# Patient Record
Sex: Female | Born: 1985 | Race: White | Hispanic: No | Marital: Married | Smoking: Current every day smoker
Health system: Southern US, Community
[De-identification: ages and names within clinical notes are randomized; demographics above are authoritative.]

## PROBLEM LIST (undated history)

## (undated) DIAGNOSIS — N200 Calculus of kidney: Secondary | ICD-10-CM

---

## 2014-09-03 ENCOUNTER — Emergency Department (HOSPITAL_COMMUNITY)
Admission: EM | Admit: 2014-09-03 | Discharge: 2014-09-03 | Disposition: A | Discharge status: Home / Self Care | Payer: Medicaid Other | Attending: Emergency Medicine | Admitting: Emergency Medicine

## 2014-09-03 ENCOUNTER — Encounter (HOSPITAL_COMMUNITY): Payer: Self-pay | Admitting: Physical Medicine and Rehabilitation

## 2014-09-03 DIAGNOSIS — W208XXA Other cause of strike by thrown, projected or falling object, initial encounter: Secondary | ICD-10-CM | POA: Diagnosis not present

## 2014-09-03 DIAGNOSIS — N23 Unspecified renal colic: Secondary | ICD-10-CM | POA: Insufficient documentation

## 2014-09-03 DIAGNOSIS — Z720 Tobacco use: Secondary | ICD-10-CM | POA: Diagnosis not present

## 2014-09-03 DIAGNOSIS — R109 Unspecified abdominal pain: Secondary | ICD-10-CM | POA: Diagnosis present

## 2014-09-03 HISTORY — DX: Calculus of kidney: N20.0

## 2014-09-03 LAB — CBC WITH DIFFERENTIAL/PLATELET
BASOS PCT: 1 % (ref 0–1)
Basophils Absolute: 0.1 10*3/uL (ref 0.0–0.1)
EOS ABS: 0.4 10*3/uL (ref 0.0–0.7)
EOS PCT: 4 % (ref 0–5)
HCT: 42.8 % (ref 36.0–46.0)
HEMOGLOBIN: 14.1 g/dL (ref 12.0–15.0)
LYMPHS ABS: 2.4 10*3/uL (ref 0.7–4.0)
Lymphocytes Relative: 26 % (ref 12–46)
MCH: 28.1 pg (ref 26.0–34.0)
MCHC: 32.9 g/dL (ref 30.0–36.0)
MCV: 85.3 fL (ref 78.0–100.0)
Monocytes Absolute: 0.4 10*3/uL (ref 0.1–1.0)
Monocytes Relative: 5 % (ref 3–12)
NEUTROS PCT: 64 % (ref 43–77)
Neutro Abs: 5.8 10*3/uL (ref 1.7–7.7)
PLATELETS: 198 10*3/uL (ref 150–400)
RBC: 5.02 MIL/uL (ref 3.87–5.11)
RDW: 14.3 % (ref 11.5–15.5)
WBC: 9 10*3/uL (ref 4.0–10.5)

## 2014-09-03 LAB — COMPREHENSIVE METABOLIC PANEL
ALBUMIN: 4.3 g/dL (ref 3.5–5.0)
ALT: 52 U/L (ref 14–54)
ANION GAP: 10 (ref 5–15)
AST: 49 U/L — ABNORMAL HIGH (ref 15–41)
Alkaline Phosphatase: 53 U/L (ref 38–126)
BUN: 5 mg/dL — ABNORMAL LOW (ref 6–20)
CHLORIDE: 108 mmol/L (ref 101–111)
CO2: 24 mmol/L (ref 22–32)
CREATININE: 0.8 mg/dL (ref 0.44–1.00)
Calcium: 8.8 mg/dL — ABNORMAL LOW (ref 8.9–10.3)
GFR calc non Af Amer: >60 mL/min (ref >60)
GLUCOSE: 78 mg/dL (ref 65–99)
Potassium: 3.6 mmol/L (ref 3.5–5.1)
SODIUM: 142 mmol/L (ref 135–145)
Total Bilirubin: 0.6 mg/dL (ref 0.3–1.2)
Total Protein: 7.4 g/dL (ref 6.5–8.1)

## 2014-09-03 LAB — URINE MICROSCOPIC-ADD ON

## 2014-09-03 LAB — URINALYSIS, ROUTINE W REFLEX MICROSCOPIC
BILIRUBIN URINE: NEGATIVE
Glucose, UA: NEGATIVE mg/dL
KETONES UR: 15 mg/dL — AB
LEUKOCYTES UA: NEGATIVE
NITRITE: NEGATIVE
Protein, ur: 30 mg/dL — AB
Specific Gravity, Urine: 1.044 — ABNORMAL HIGH (ref 1.005–1.030)
UROBILINOGEN UA: 1 mg/dL (ref 0.0–1.0)
pH: 7 (ref 5.0–8.0)

## 2014-09-03 LAB — I-STAT BETA HCG BLOOD, ED (MC, WL, AP ONLY)

## 2014-09-03 LAB — LIPASE, BLOOD: Lipase: 38 U/L (ref 22–51)

## 2014-09-03 MED ORDER — OXYCODONE-ACETAMINOPHEN 5-325 MG PO TABS: ORAL_TABLET | ORAL | Status: DC

## 2014-09-03 NOTE — ED Notes (Signed)
Pt presents to department for evaluation of L sided flank pain. Onset today. History of kidney stones. 8/10 pain upon arrival to ED. Pt is alert and oriented x4.

## 2014-09-03 NOTE — Discharge Instructions (Signed)
Do not hesitate to return to the emergency room for any new, worsening or concerning symptoms.  Please obtain primary care using resource guide below. Let them know that you were seen in the emergency room and that they will need to obtain records for further outpatient management.  Take percocet for breakthrough pain, do not drink alcohol, drive, care for children or do other critical tasks while taking percocet.  Emergency Department Resource Guide 1) Find a Doctor and Pay Out of Pocket Although you won't have to find out who is covered by your insurance plan, it is a good idea to ask around and get recommendations. You will then need to call the office and see if the doctor you have chosen will accept you as a new patient and what types of options they offer for patients who are self-pay. Some doctors offer discounts or will set up payment plans for their patients who do not have insurance, but you will need to ask so you aren't surprised when you get to your appointment.  2) Contact Your Local Health Department Not all health departments have doctors that can see patients for sick visits, but many do, so it is worth a call to see if yours does. If you don't know where your local health department is, you can check in your phone book. The CDC also has a tool to help you locate your state's health department, and many state websites also have listings of all of their local health departments.  3) Find a Walk-in Clinic If your illness is not likely to be very severe or complicated, you may want to try a walk in clinic. These are popping up all over the country in pharmacies, drugstores, and shopping centers. They're usually staffed by nurse practitioners or physician assistants that have been trained to treat common illnesses and complaints. They're usually fairly quick and inexpensive. However, if you have serious medical issues or chronic medical problems, these are probably not your best  option.  No Primary Care Doctor: - Call Health Connect at  [phone] - they can help you locate a primary care doctor that  accepts your insurance, provides certain services, etc. - Physician Referral Service- [phone]  Chronic Pain Problems: Organization         Address  Phone   Notes  [facility]  [phone] Patients need to be referred by their primary care doctor.   Medication Assistance: Organization         Address  Phone   Notes  [facility] [address] [place], [place] [address] [phone] --Must be a resident of [facility] -- Must have NO insurance coverage whatsoever (no Medicaid/ Medicare, etc.) -- The pt. MUST have a primary care doctor that directs their care regularly and follows them in the community   [facility]  [phone]   [employer]  [phone]    Agencies that provide inexpensive medical care: Organization         Address  Phone   Notes  [facility]  [phone]   [facility]    [phone]   [facility] [address] [place], [place] [address] [phone]   [facility] [address] [place] [place] [phone]   Planned Parenthood    [phone]   [facility]    [phone]   [facility]  [address], [place] Phone:  [phone], Fax:  [phone] Hours of Operation:  9 am - 6 pm, M-F.  Also accepts Medicaid/Medicare and self-pay.  [facility] for [place], [address], [place] Phone: [phone], Fax: [phone]. Hours of Operation:  8:30 am - 5:30 pm, M-F.  Also accepts Medicaid and self-pay.  [facility] [place] [address], [place] Phone: [phone]   [place], [place], [place] [phone], Ext. [phone] Mondays & Thursdays: 7-9 AM.  First 15  patients are seen on a first come, first serve basis.    [place] Providers:  Organization         Address  Phone   Notes  [facility] [address], [place] [phone] Also accepts self-pay patients.  [facility] [address] [place], [place]  [phone]   [place], [address], [place] [phone]   [facility] [address] [place] [phone]   Lucianne Lei [address], [place]   [phone] Only accepts [place] Access [place] patients after they have their name applied to their card.   Self-Pay (no insurance) in [facility]:  Organization         Address  Phone   Notes  Sickle Cell Patients, [facility] [place] [phone]   [facility] [place] [phone]   [facility] [place]  [place], [place], [place] [phone]   [facility]/Dr. Osei-Bonsu  [address], [place] [address], [place] [phone] Phone number for both [place] and [place] locations is the same.  [facility]gent [facility] and [facility] [address] [place] [phone]   [facility] [address], [place] or [address] [phone]   [facility] [address] [place] [phone], phone; [phone], fax Sees patients 1st and 3rd Saturday of every month.  Must not qualify for public or private insurance (i.e. Medicaid, Medicare, [place] Health Choice, Veterans' Benefits)  Household income should be no more than 200% of the poverty level The clinic cannot treat you if you are pregnant or think you are pregnant  Sexually transmitted diseases are not treated at the clinic.    Dental  Care: Organization         Address  Phone  Notes  [facility] Department of [facility] [place] [phone] Accepts children up to age 61 who are enrolled in [place] or [employer]; pregnant women with a Medicaid card; and children who have applied for Medicaid or [place] Health Choice, but were declined, whose parents can pay a reduced fee at time of service.  [facility] Department of [facility]  [address], [place] [phone] Accepts children up to age 47 who are enrolled in [place] or [place]; pregnant women with a Medicaid card; and children who have applied for Medicaid or [place] Health Choice, but were declined, whose parents can pay a reduced fee at time of service.  [facility] Adult Dental Access PROGRAM  [place], [place] [phone]  Patients are seen by appointment only. Walk-ins are not accepted. [place] will see patients 1 years of age and older. Monday - Tuesday (8am-5pm) Most Wednesdays (8:30-5pm) $30 per visit, cash only  [facility] Adult Dental Access PROGRAM  [address], [facility] [phone] Patients are seen by appointment only. Walk-ins are not accepted. [place] will see patients 28 years of age and older. One Wednesday Evening (Monthly: Volunteer Based).  $30 per visit, cash only  [place]  [phone] for adults; Children under age 81, call Graduate Pediatric Dentistry at [phone]. Children aged 82-14, please call [phone] to request a pediatric application.  Dental services are provided in all areas of dental care including fillings, crowns and bridges, complete and partial dentures, implants, gum treatment, root canals, and extractions. Preventive care is also provided. Treatment is provided to both adults and children. Patients are selected via a lottery and there is often a waiting list.   [facility] [address] [place]  [phone] [email or web address]   [facility] [address] [place], [place] [phone], Ext. [phone] Second and Fourth Thursday of each month, opens at 6:30 AM; Clinic ends at 9 AM.  Patients are seen on a first-come first-served basis, and a limited number are seen during each clinic.   [facility]  [address] Hillard Danker [place], [place] [phone]   Eligibility Requirements You must have lived in [place], [place], or [place] counties for at least the last three months.   You cannot be eligible for state or federal sponsored [employer], including [employer], [place], or [employer].   You generally cannot be eligible for healthcare insurance through your employer.    How to apply: Eligibility screenings are held every Tuesday and Wednesday afternoon from 1:00 pm until 4:00 pm. You do not need an appointment for the interview!  [facility] [address] [place], [place]   [place]  [place] Department  [place]  [phone]    Behavioral Health Resources in the Community: Intensive Outpatient Programs Organization         Address  Phone  Notes  [place]. [address], [place], [place] [phone]   [facility] Outpatient [address] [place], [place]   ADS: Alcohol & Drug Svcs [address], [place], [place]   [place] [address],  [place], [place] or [phone]   Substance Abuse Resources Organization         Address  Phone  Notes  Alcohol and Drug Services  [phone]   [place]  [phone]   The [place]   Chinita Pester  [phone]   Residential & Outpatient Substance Abuse Program  [phone]    Psychological Services Organization         Address  Phone  Notes  [facility] [place]  [place]  [phone]   [place] [address], [place] or [phone]    Mobile Crisis Teams Organization         Address  Phone  Notes  [facility], Mobile Crisis Care Unit  [phone]   [facility]  [address] [employer], [place]   [facility] [place] [address] [place]  [place] [phone]    Self-Help/Support Groups Organization         Address  Phone             Notes  [facility]ental Health Assoc. of [place] - variety of support groups  [place] Call for more information  Narcotics Anonymous (NA), [facility]aring [facility] [address], [employer] [place]  2 meetings at this location   Special educational needs teacher         Address  Phone  Notes  ASAP Residential Treatment [place],    [place]  [phone]   [facility]  [address] [place] [phone], [place], [place]   [place], [place] [phone] Admissions: 8am-3pm M-F  Incentives Substance [place] [address],    [place], [place] [phone]   [facility] [address] [place], [place], [place]   [facility] [address]  [place], [place]   [facility] - Intensive Outpatient [address], [place], [place]   [facility] [place].,  [place], [place] [phone] or [phone]   [facility] (RTS) [address], [place], [place] Accepts Medicaid  [facility] [place] [address]  [place] [phone] Substance Abuse/Addiction Treatment   [facility] Organization         Address  Phone  Notes  [facility]  [phone]   Domenic Schwab, PhD [address] [place]lis [place], [place]   [phone] or [phone]   [place], [place] [phone]   [facility] [address], [place], [place] [phone] Insurance/Medicaid/sponsorship through [facility] and Families [address] [place]                                    [place], [place] [phone] [place] [address], [place] [phone]    Dr. Adele Schilder  [phone]   [facility] Dept. 1) [address],  2) [place] 3)  [place] [address], [place] [phone]  [phone]   [place] [phone] or [phone] (After Hours)

## 2014-09-03 NOTE — ED Provider Notes (Signed)
CSN: [id number]     Arrival date & time 09/03/14  1157 History   First MD Initiated Contact with Patient 09/03/14 1406     Chief Complaint  Patient presents with  . Flank Pain     (Consider location/radiation/quality/duration/timing/severity/associated sxs/prior Treatment) HPI   Blood pressure 144/81, pulse 99, temperature 98 F (36.7 C), temperature source Oral, resp. rate 18, height 5\' 6"  (1.676 m), weight 213 lb 12.8 oz (96.979 kg), SpO2 96 %.  Kaitlyn Clements is a 29 y.o. female complaining of severe left flank pain onset this morning, waxing and waning consistent with prior episodes of nephrolithiasis. Patient denies fever, chills, nausea, vomiting, dysuria, hematuria (she is menstruating). Patient has had 15 kidney stones in the last 11 years, she is normally able to pass them on her own but has required one stent/lithotripsy. She [facility]rologist in [facility]. Patient has had 3 CAT scans in the last 12 months.  Past Medical History  Diagnosis Date  . Kidney stones    History reviewed. No pertinent past surgical history. No family history on file. Social History  Substance Use Topics  . Smoking status: Current Every Day Smoker  . Smokeless tobacco: None  . Alcohol Use: No   OB History    No data available     Review of Systems  10 systems reviewed and found to be negative, except as noted in the HPI.  Allergies  Review of patient's allergies indicates no known allergies.  Home Medications   Prior to Admission medications   Medication Sig Start Date End Date Taking? Authorizing Provider  oxyCODONE-acetaminophen (PERCOCET/ROXICET) 5-325 MG per tablet 1 to 2 tabs PO q6hrs  PRN for pain 09/03/14   Haeli Gerlich, PA-C   BP 144/81 mmHg  Pulse 99  Temp(Src) 98 F (36.7 C) (Oral)  Resp 18  Ht 5\' 6"  (1.676 m)  Wt 213 lb 12.8 oz (96.979 kg)  BMI 34.52 kg/m2  SpO2 96% Physical Exam  Constitutional: She is oriented to person, place, and time. She appears  well-developed and well-nourished. No distress.  HENT:  Head: Normocephalic.  Mouth/Throat: Oropharynx is clear and moist.  Eyes: Conjunctivae and EOM are normal. Pupils are equal, round, and reactive to light.  Neck: Normal range of motion.  Cardiovascular: Normal rate, regular rhythm and intact distal pulses.   Pulmonary/Chest: Effort normal and breath sounds normal. No stridor.  Abdominal: Soft. Bowel sounds are normal. She exhibits no distension and no mass. There is no tenderness. There is no rebound and no guarding.  Genitourinary:  No CVA tenderness to percussion bilaterally  Musculoskeletal: Normal range of motion.  Neurological: She is alert and oriented to person, place, and time.  Psychiatric: She has a normal mood and affect.  Nursing note and vitals reviewed.   ED Course  Procedures (including critical care time) Labs Review Labs Reviewed  COMPREHENSIVE METABOLIC PANEL - Abnormal; Notable for the following:    BUN 5 (*)    Calcium 8.8 (*)    AST 49 (*)    All other components within normal limits  URINALYSIS, ROUTINE W REFLEX MICROSCOPIC (NOT AT [facility]) - Abnormal; Notable for the following:    APPearance TURBID (*)    Specific Gravity, Urine 1.044 (*)    Hgb urine dipstick LARGE (*)    Ketones, ur 15 (*)    Protein, ur 30 (*)    All other components within normal limits  URINE MICROSCOPIC-ADD ON - Abnormal; Notable for the following:  Squamous Epithelial / LPF MANY (*)    Casts WAXY CAST (*)    All other components within normal limits  CBC WITH DIFFERENTIAL/PLATELET  LIPASE, BLOOD  I-STAT BETA HCG BLOOD, ED ([facility], [facility], AP ONLY)    Imaging Review No results found. I have personally reviewed and evaluated these images and lab results as part of my medical decision-making.   EKG Interpretation None      MDM   Final diagnoses:  Renal colic on left side    Filed Vitals:   09/03/14 1204  BP: 144/81  Pulse: 99  Temp: 98 F (36.7 C)  TempSrc: Oral   Resp: 18  Height:  (1.676 m)  Weight: 213 lb 12.8 oz (96.979 kg)  SpO2: 96%     Kaitlyn Clements is a pleasant 29 y.o. female presenting with left flank pain consistent with prior kidney stones. Symptoms just started today. Patient denies any UTI symptoms, UA is not consistent with infection. Asian has had 15 stones in the last 11 years, she has required one stent in one lithotripsy for single 6 mm stone. She has a urologist in [place]. States that her medications got reinstated and she can follow back up with him. We've discussed the pros and cons of obtaining a CT, and shared decision-making we have decided to hold off on the CT, have advised her that if she has any signs of infection or worsening pain she is to return to the ED immediately. Also if she cannot get into see her urologist and has continued pain in the next 3-4 days she is to return for recheck. Patient verbalizes her understanding. She is driving herself and her 2 sons home today so pain medication choices in the ED are limited. Patient will be discharged home with a prescription for Percocet.  Evaluation does not show pathology that would require ongoing emergent intervention or inpatient treatment. Pt is hemodynamically stable and mentating appropriately. Discussed findings and plan with patient/guardian, who agrees with care plan. All questions answered. Return precautions discussed and outpatient follow up given.   New Prescriptions   OXYCODONE-ACETAMINOPHEN (PERCOCET/ROXICET) 5-325 MG PER TABLET    1 to 2 tabs PO q6hrs  PRN for pain         Wynetta Emery, PA-C 09/03/14 1439  Cathren Laine, MD 09/03/14 [phone]

## 2014-09-03 NOTE — ED Notes (Signed)
Pt is in stable condition upon d/c and is escorted from ED via wheelchair. 

## 2014-10-15 ENCOUNTER — Emergency Department (HOSPITAL_COMMUNITY): Payer: Medicaid Other

## 2014-10-15 ENCOUNTER — Encounter (HOSPITAL_COMMUNITY): Payer: Self-pay | Admitting: Emergency Medicine

## 2014-10-15 ENCOUNTER — Emergency Department (HOSPITAL_COMMUNITY)
Admission: EM | Admit: 2014-10-15 | Discharge: 2014-10-15 | Disposition: A | Discharge status: Home / Self Care | Payer: Medicaid Other | Attending: Emergency Medicine | Admitting: Emergency Medicine

## 2014-10-15 DIAGNOSIS — Z87442 Personal history of urinary calculi: Secondary | ICD-10-CM | POA: Insufficient documentation

## 2014-10-15 DIAGNOSIS — Z3202 Encounter for pregnancy test, result negative: Secondary | ICD-10-CM | POA: Diagnosis not present

## 2014-10-15 DIAGNOSIS — R109 Unspecified abdominal pain: Secondary | ICD-10-CM

## 2014-10-15 DIAGNOSIS — R319 Hematuria, unspecified: Secondary | ICD-10-CM | POA: Diagnosis not present

## 2014-10-15 DIAGNOSIS — Z720 Tobacco use: Secondary | ICD-10-CM | POA: Diagnosis not present

## 2014-10-15 LAB — CBC
HEMATOCRIT: 43.4 % (ref 36.0–46.0)
Hemoglobin: 14.1 g/dL (ref 12.0–15.0)
MCH: 27.7 pg (ref 26.0–34.0)
MCHC: 32.5 g/dL (ref 30.0–36.0)
MCV: 85.3 fL (ref 78.0–100.0)
PLATELETS: 175 10*3/uL (ref 150–400)
RBC: 5.09 MIL/uL (ref 3.87–5.11)
RDW: 14.1 % (ref 11.5–15.5)
WBC: 9.3 10*3/uL (ref 4.0–10.5)

## 2014-10-15 LAB — URINALYSIS, ROUTINE W REFLEX MICROSCOPIC
BILIRUBIN URINE: NEGATIVE
Glucose, UA: NEGATIVE mg/dL
KETONES UR: NEGATIVE mg/dL
Leukocytes, UA: NEGATIVE
NITRITE: NEGATIVE
Protein, ur: NEGATIVE mg/dL
SPECIFIC GRAVITY, URINE: 1.025 (ref 1.005–1.030)
UROBILINOGEN UA: 0.2 mg/dL (ref 0.0–1.0)
pH: 6 (ref 5.0–8.0)

## 2014-10-15 LAB — BASIC METABOLIC PANEL
Anion gap: 10 (ref 5–15)
BUN: 8 mg/dL (ref 6–20)
CALCIUM: 8.9 mg/dL (ref 8.9–10.3)
CO2: 24 mmol/L (ref 22–32)
CREATININE: 0.78 mg/dL (ref 0.44–1.00)
Chloride: 102 mmol/L (ref 101–111)
GLUCOSE: 100 mg/dL — AB (ref 65–99)
Potassium: 3.6 mmol/L (ref 3.5–5.1)
Sodium: 136 mmol/L (ref 135–145)

## 2014-10-15 LAB — URINE MICROSCOPIC-ADD ON

## 2014-10-15 LAB — PREGNANCY, URINE: Preg Test, Ur: NEGATIVE

## 2014-10-15 LAB — POC URINE PREG, ED: PREG TEST UR: NEGATIVE

## 2014-10-15 MED ORDER — ONDANSETRON 8 MG PO TBDP: 8.0000 mg | ORAL_TABLET | Freq: Three times a day (TID) | ORAL | Status: DC | PRN

## 2014-10-15 MED ORDER — IBUPROFEN 600 MG PO TABS: 600.0000 mg | ORAL_TABLET | Freq: Three times a day (TID) | ORAL | Status: DC | PRN

## 2014-10-15 MED ORDER — SODIUM CHLORIDE 0.9 % IV BOLUS (SEPSIS)
1000.0000 mL | Freq: Once | INTRAVENOUS | Status: AC
  Administered 2014-10-15: 1000 mL via INTRAVENOUS

## 2014-10-15 MED ORDER — HYDROCODONE-ACETAMINOPHEN 5-325 MG PO TABS: 1.0000 | ORAL_TABLET | Freq: Four times a day (QID) | ORAL | Status: AC | PRN

## 2014-10-15 MED ORDER — KETOROLAC TROMETHAMINE 30 MG/ML IJ SOLN
30.0000 mg | Freq: Once | INTRAMUSCULAR | Status: AC
  Administered 2014-10-15: 30 mg via INTRAVENOUS
  Filled 2014-10-15: qty 1

## 2014-10-15 MED ORDER — ONDANSETRON HCL 4 MG/2ML IJ SOLN
4.0000 mg | Freq: Once | INTRAMUSCULAR | Status: AC
  Administered 2014-10-15: 4 mg via INTRAVENOUS
  Filled 2014-10-15: qty 2

## 2014-10-15 NOTE — ED Provider Notes (Signed)
CSN: [id number]     Arrival date & time 10/15/14  0759 History   First MD Initiated Contact with Patient 10/15/14 [phone]     Chief Complaint  Patient presents with  . Flank Pain     HPI Patient presents to emergency department complaining of left flank pain without radiation.  She does have some "tingling" when she urinates.  She reports the nursing staff that she's had some increased frequency as well.  She does have a history of kidney stones and states this feels similar.  Her symptoms began this morning.  Her pain woke her up.  She reports nausea without vomiting.  No abnormal menstrual cycles.  Pain is moderate in severity.  Denies fevers and chills.  Past Medical History  Diagnosis Date  . Kidney stones    Past Surgical History  Procedure Laterality Date  . Cesarean section     No family history on file. Social History  Substance Use Topics  . Smoking status: Current Every Day Smoker -- 1.00 packs/day  . Smokeless tobacco: None  . Alcohol Use: No   OB History    No data available     Review of Systems  All other systems reviewed and are negative.     Allergies  Ciprofloxacin  Home Medications   Prior to Admission medications   Medication Sig Start Date End Date Taking? Authorizing Provider  oxyCODONE-acetaminophen (PERCOCET/ROXICET) 5-325 MG per tablet 1 to 2 tabs PO q6hrs  PRN for pain 09/03/14   Joni Reining Pisciotta, PA-C   BP 132/86 mmHg  Pulse 96  Temp(Src) 98.9 F (37.2 C) (Oral)  Resp 20  Ht  (1.676 m)  Wt 215 lb (97.523 kg)  BMI 34.72 kg/m2  SpO2 98%  LMP 10/02/2014 Physical Exam  Constitutional: She is oriented to person, place, and time. She appears well-developed and well-nourished. No distress.  HENT:  Head: Normocephalic and atraumatic.  Eyes: EOM are normal.  Neck: Normal range of motion.  Cardiovascular: Normal rate, regular rhythm and normal heart sounds.   Pulmonary/Chest: Effort normal and breath sounds normal.  Abdominal: Soft. She  exhibits no distension. There is no tenderness.  Genitourinary:  Mild left CVA tenderness.  No rash.  Musculoskeletal: Normal range of motion.  Neurological: She is alert and oriented to person, place, and time.  Skin: Skin is warm and dry. No rash noted.  Psychiatric: She has a normal mood and affect. Judgment normal.  Nursing note and vitals reviewed.   ED Course  Procedures (including critical care time) Labs Review Labs Reviewed  URINALYSIS, ROUTINE W REFLEX MICROSCOPIC (NOT AT [facility]) - Abnormal; Notable for the following:    APPearance CLOUDY (*)    Hgb urine dipstick LARGE (*)    All other components within normal limits  BASIC METABOLIC PANEL - Abnormal; Notable for the following:    Glucose, Bld 100 (*)    All other components within normal limits  URINE MICROSCOPIC-ADD ON - Abnormal; Notable for the following:    Squamous Epithelial / LPF MANY (*)    Bacteria, UA MANY (*)    All other components within normal limits  CBC  PREGNANCY, URINE  POC URINE PREG, ED    Imaging Review US Renal  10/15/2014   CLINICAL DATA:  29 year old female with left flank pain. Initial encounter. Current history of kidney stones.  EXAM: RENAL / URINARY TRACT ULTRASOUND COMPLETE  COMPARISON:  [facility] CT Abdomen and Pelvis 10/07/2014, and earlier  FINDINGS:  Right Kidney:  Length: 11.3 cm. Echogenicity within normal limits. No mass or hydronephrosis visualized.  Left Kidney:  Length: 10.3 cm. Echogenicity within normal limits. No mass or hydronephrosis visualized. The recently seen punctate nonobstructing calculus in the left renal upper pole might be visible on image 21.  Bladder:  Decompressed and not visualized, the patient voided prior to imaging.  Other findings: Hepatic steatosis (image 10).  IMPRESSION: 1. No obstructive uropathy or acute renal findings. 2. Fatty liver disease.   Electronically Signed   By: Odessa Fleming M.D.   On: 10/15/2014 10:09   I have personally  reviewed and evaluated these images and lab results as part of my medical decision-making.   EKG Interpretation None      MDM   Final diagnoses:  None    Polynephritis versus ureteral colic.  Renal ultrasound at this time.  Labs, urine, urine present.  Symptomatic treatment.  10:34 AM Patient feels much better this time.  Suspect recently passed left-sided ureteral stone.  No hydro-.  Discharge home in good condition.  Standard precautions given.    Azalia Bilis, MD 10/15/14 [phone]

## 2014-10-15 NOTE — ED Notes (Signed)
Pt awoke from sleep to Left sided flank pain. Pt reports nausea, denies vomiting.  Pt reports "tingling" when urinating, with increased frequency. Pt restless, appears uncomfortable.

## 2014-10-15 NOTE — Discharge Instructions (Signed)
Flank Pain °Flank pain refers to pain that is located on the side of the body between the upper abdomen and the back. The pain may occur over a short period of time (acute) or may be long-term or reoccurring (chronic). It may be mild or severe. Flank pain can be caused by many things. °CAUSES  °Some of the more common causes of flank pain include: °· Muscle strains.   °· Muscle spasms.   °· A disease of your spine (vertebral disk disease).   °· A lung infection (pneumonia).   °· Fluid around your lungs (pulmonary edema).   °· A kidney infection.   °· Kidney stones.   °· A very painful skin rash caused by the chickenpox virus (shingles).   °· Gallbladder disease.   °HOME CARE INSTRUCTIONS  °Home care will depend on the cause of your pain. In general, °· Rest as directed by your caregiver. °· Drink enough fluids to keep your urine clear or pale yellow. °· Only take over-the-counter or prescription medicines as directed by your caregiver. Some medicines may help relieve the pain. °· Tell your caregiver about any changes in your pain. °· Follow up with your caregiver as directed. °SEEK IMMEDIATE MEDICAL CARE IF:  °· Your pain is not controlled with medicine.   °· You have new or worsening symptoms. °· Your pain increases.   °· You have abdominal pain.   °· You have shortness of breath.   °· You have persistent nausea or vomiting.   °· You have swelling in your abdomen.   °· You feel faint or pass out.   °· You have blood in your urine. °· You have a fever or persistent symptoms for more than 2-3 days. °· You have a fever and your symptoms suddenly get worse. °MAKE SURE YOU:  °· Understand these instructions. °· Will watch your condition. °· Will get help right away if you are not doing well or get worse. °  °This information is not intended to replace advice given to you by your health care provider. Make sure you discuss any questions you have with your health care provider. °  °Document Released: 02/17/2005 Document  Revised: 09/21/2011 Document Reviewed: 08/11/2011 °Elsevier Interactive Patient Education ©2016 [employer] ° °

## 2015-01-13 ENCOUNTER — Encounter (HOSPITAL_BASED_OUTPATIENT_CLINIC_OR_DEPARTMENT_OTHER): Payer: Self-pay | Admitting: Emergency Medicine

## 2015-01-13 ENCOUNTER — Emergency Department (HOSPITAL_BASED_OUTPATIENT_CLINIC_OR_DEPARTMENT_OTHER)
Admission: EM | Admit: 2015-01-13 | Discharge: 2015-01-13 | Disposition: A | Discharge status: Home / Self Care | Payer: Medicaid Other | Attending: Emergency Medicine | Admitting: Emergency Medicine

## 2015-01-13 ENCOUNTER — Emergency Department (HOSPITAL_BASED_OUTPATIENT_CLINIC_OR_DEPARTMENT_OTHER): Payer: Medicaid Other

## 2015-01-13 DIAGNOSIS — Z9889 Other specified postprocedural states: Secondary | ICD-10-CM | POA: Diagnosis not present

## 2015-01-13 DIAGNOSIS — Z79899 Other long term (current) drug therapy: Secondary | ICD-10-CM | POA: Insufficient documentation

## 2015-01-13 DIAGNOSIS — Z3202 Encounter for pregnancy test, result negative: Secondary | ICD-10-CM | POA: Insufficient documentation

## 2015-01-13 DIAGNOSIS — F17200 Nicotine dependence, unspecified, uncomplicated: Secondary | ICD-10-CM | POA: Diagnosis not present

## 2015-01-13 DIAGNOSIS — N200 Calculus of kidney: Secondary | ICD-10-CM | POA: Diagnosis not present

## 2015-01-13 DIAGNOSIS — R109 Unspecified abdominal pain: Secondary | ICD-10-CM | POA: Diagnosis present

## 2015-01-13 LAB — URINE MICROSCOPIC-ADD ON

## 2015-01-13 LAB — COMPREHENSIVE METABOLIC PANEL
ALBUMIN: 4.3 g/dL (ref 3.5–5.0)
ALT: 35 U/L (ref 14–54)
ANION GAP: 6 (ref 5–15)
AST: 31 U/L (ref 15–41)
Alkaline Phosphatase: 56 U/L (ref 38–126)
BUN: 11 mg/dL (ref 6–20)
CHLORIDE: 109 mmol/L (ref 101–111)
CO2: 24 mmol/L (ref 22–32)
Calcium: 8.8 mg/dL — ABNORMAL LOW (ref 8.9–10.3)
Creatinine, Ser: 0.68 mg/dL (ref 0.44–1.00)
GFR calc Af Amer: >60 mL/min (ref >60)
GLUCOSE: 94 mg/dL (ref 65–99)
POTASSIUM: 3.6 mmol/L (ref 3.5–5.1)
Sodium: 139 mmol/L (ref 135–145)
Total Bilirubin: 0.4 mg/dL (ref 0.3–1.2)
Total Protein: 7.5 g/dL (ref 6.5–8.1)

## 2015-01-13 LAB — CBC WITH DIFFERENTIAL/PLATELET
BASOS ABS: 0 10*3/uL (ref 0.0–0.1)
BASOS PCT: 0 %
EOS ABS: 0.3 10*3/uL (ref 0.0–0.7)
EOS PCT: 5 %
HCT: 41.6 % (ref 36.0–46.0)
Hemoglobin: 13.4 g/dL (ref 12.0–15.0)
Lymphocytes Relative: 28 %
Lymphs Abs: 1.7 10*3/uL (ref 0.7–4.0)
MCH: 27.5 pg (ref 26.0–34.0)
MCHC: 32.2 g/dL (ref 30.0–36.0)
MCV: 85.2 fL (ref 78.0–100.0)
MONO ABS: 0.5 10*3/uL (ref 0.1–1.0)
Monocytes Relative: 7 %
Neutro Abs: 3.8 10*3/uL (ref 1.7–7.7)
Neutrophils Relative %: 60 %
PLATELETS: 164 10*3/uL (ref 150–400)
RBC: 4.88 MIL/uL (ref 3.87–5.11)
RDW: 14.9 % (ref 11.5–15.5)
WBC: 6.2 10*3/uL (ref 4.0–10.5)

## 2015-01-13 LAB — URINALYSIS, ROUTINE W REFLEX MICROSCOPIC
Bilirubin Urine: NEGATIVE
Glucose, UA: NEGATIVE mg/dL
Ketones, ur: NEGATIVE mg/dL
Leukocytes, UA: NEGATIVE
NITRITE: NEGATIVE
Protein, ur: NEGATIVE mg/dL
SPECIFIC GRAVITY, URINE: 1.023 (ref 1.005–1.030)
pH: 6 (ref 5.0–8.0)

## 2015-01-13 LAB — PREGNANCY, URINE: PREG TEST UR: NEGATIVE

## 2015-01-13 MED ORDER — IBUPROFEN 600 MG PO TABS: 600.0000 mg | ORAL_TABLET | Freq: Three times a day (TID) | ORAL | Status: AC | PRN

## 2015-01-13 MED ORDER — SODIUM CHLORIDE 0.9 % IV BOLUS (SEPSIS)
1000.0000 mL | Freq: Once | INTRAVENOUS | Status: AC
Start: 2015-01-13 — End: 2015-01-13
  Administered 2015-01-13: 1000 mL via INTRAVENOUS

## 2015-01-13 MED ORDER — ONDANSETRON HCL 4 MG/2ML IJ SOLN
4.0000 mg | Freq: Once | INTRAMUSCULAR | Status: AC
  Administered 2015-01-13: 4 mg via INTRAVENOUS
  Filled 2015-01-13: qty 2

## 2015-01-13 MED ORDER — ONDANSETRON 8 MG PO TBDP: 8.0000 mg | ORAL_TABLET | Freq: Three times a day (TID) | ORAL | Status: AC | PRN

## 2015-01-13 MED ORDER — KETOROLAC TROMETHAMINE 15 MG/ML IJ SOLN
15.0000 mg | Freq: Once | INTRAMUSCULAR | Status: AC
Start: 2015-01-13 — End: 2015-01-13
  Administered 2015-01-13: 15 mg via INTRAVENOUS
  Filled 2015-01-13: qty 1

## 2015-01-13 NOTE — ED Provider Notes (Signed)
CSN: [id number]     Arrival date & time 01/13/15  0827 History   First MD Initiated Contact with Patient 01/13/15 [phone]     Chief Complaint  Patient presents with  . Flank Pain     (Consider location/radiation/quality/duration/timing/severity/associated sxs/prior Treatment) HPI Comments: Left flank pain, nonradiating now Nausea and vomiting 1 episode earlier Tingling sensation with urination No dysuria, hematuria, fevers No diarrhea, constipation Feels like last kidney stones Hx of lithotripsy x1   Patient is a 30 y.o. female presenting with flank pain.  Flank Pain This is a new problem. The problem occurs constantly. The problem has not changed since onset.Pertinent negatives include no chest pain, no abdominal pain, no headaches and no shortness of breath. Nothing aggravates the symptoms. Nothing relieves the symptoms. She has tried nothing (toradol in ED helped) for the symptoms. The treatment provided moderate relief.    Past Medical History  Diagnosis Date  . Kidney stones    Past Surgical History  Procedure Laterality Date  . Cesarean section     No family history on file. Social History  Substance Use Topics  . Smoking status: Current Every Day Smoker -- 1.00 packs/day  . Smokeless tobacco: None  . Alcohol Use: No   OB History    No data available     Review of Systems  Constitutional: Negative for fever.  HENT: Negative for sore throat.   Eyes: Negative for visual disturbance.  Respiratory: Negative for cough and shortness of breath.   Cardiovascular: Negative for chest pain.  Gastrointestinal: Positive for nausea and vomiting. Negative for abdominal pain.  Genitourinary: Positive for flank pain. Negative for difficulty urinating.  Musculoskeletal: Negative for back pain and neck pain.  Skin: Negative for rash.  Neurological: Negative for syncope and headaches.      Allergies  Ciprofloxacin  Home Medications   Prior to Admission medications    Medication Sig Start Date End Date Taking? Authorizing Provider  albuterol (PROVENTIL HFA;VENTOLIN HFA) 108 (90 BASE) MCG/ACT inhaler Inhale 1-2 puffs into the lungs every 4 (four) hours as needed for shortness of breath.  04/12/13   Historical Provider, MD  HYDROcodone-acetaminophen (NORCO/VICODIN) 5-325 MG tablet Take 1 tablet by mouth every 6 (six) hours as needed for moderate pain. 10/15/14   Azalia Bilis, MD  ibuprofen (ADVIL,MOTRIN) 600 MG tablet Take 1 tablet (600 mg total) by mouth every 8 (eight) hours as needed. 01/13/15   Alvira Monday, MD  ondansetron (ZOFRAN ODT) 8 MG disintegrating tablet Take 1 tablet (8 mg total) by mouth every 8 (eight) hours as needed for nausea or vomiting. 01/13/15   Alvira Monday, MD   BP 121/85 mmHg  Pulse 80  Temp(Src) 98.6 F (37 C) (Oral)  Resp 16  Ht 5\' 6"  (1.676 m)  Wt 200 lb (90.719 kg)  BMI 32.30 kg/m2  SpO2 100% Physical Exam  Constitutional: She is oriented to person, place, and time. She appears well-developed and well-nourished. No distress.  HENT:  Head: Normocephalic and atraumatic.  Eyes: Conjunctivae and EOM are normal.  Neck: Normal range of motion.  Cardiovascular: Normal rate, regular rhythm, normal heart sounds and intact distal pulses.  Exam reveals no gallop and no friction rub.   No murmur heard. Pulmonary/Chest: Effort normal and breath sounds normal. No respiratory distress. She has no wheezes. She has no rales.  Abdominal: Soft. She exhibits no distension. There is no tenderness. There is CVA tenderness (left). There is no guarding.  Musculoskeletal: She exhibits no edema or tenderness.  Neurological: She is alert and oriented to person, place, and time.  Skin: Skin is warm and dry. No rash noted. She is not diaphoretic. No erythema.  Nursing note and vitals reviewed.   ED Course  Procedures (including critical care time) Labs Review Labs Reviewed  URINALYSIS, ROUTINE W REFLEX MICROSCOPIC ([facility]) - Abnormal;  Notable for the following:    Hgb urine dipstick MODERATE (*)    All other components within normal limits  URINE MICROSCOPIC-ADD ON - Abnormal; Notable for the following:    Squamous Epithelial / LPF 0-5 (*)    Bacteria, UA FEW (*)    All other components within normal limits  COMPREHENSIVE METABOLIC PANEL - Abnormal; Notable for the following:    Calcium 8.8 (*)    All other components within normal limits  PREGNANCY, URINE  CBC WITH DIFFERENTIAL/PLATELET    Imaging Review Ct Renal Stone Study  01/13/2015  CLINICAL DATA:  Left flank pain since early this morning. History of stones. EXAM: CT ABDOMEN AND PELVIS WITHOUT CONTRAST TECHNIQUE: Multidetector CT imaging of the abdomen and pelvis was performed following the standard protocol without IV contrast. COMPARISON:  11/11/2014 FINDINGS: Lung bases are clear.  No effusions.  Heart is normal size. Mild diffuse fatty infiltration of the liver. No visible focal abnormality. Gallbladder, pancreas, adrenals have an unremarkable unenhanced appearance. Small calcification in the superior spleen, likely old granuloma. Punctate nonobstructing stone in the upper pole of the left kidney. No hydronephrosis or ureteral stones. Urinary bladder, uterus, adnexae a have an unremarkable unenhanced appearance. No stones on the right. Bowel grossly unremarkable.  No free fluid, free air, or adenopathy. No acute bony abnormality or focal bone lesion. IMPRESSION: Punctate left upper pole nephrolithiasis, stable. No ureteral stones or hydronephrosis. Fatty liver. Electronically Signed   By: Charlett NoseKevin  Dover M.D.   On: 01/13/2015 11:29   I have personally reviewed and evaluated these images and lab results as part of my medical decision-making.   EKG Interpretation None      MDM   Final diagnoses:  Left flank pain  Nephrolithiasis, no sign of obstructive nephrolithiasis   30 year old female with a history of nephrolithiasis presents with concern of left flank  pain. Urinalysis shows no sign of urinary tract infection. CT stone study shows a punctate left upper lobe nephrolithiasis which is stable from prior without signs of ureteral stones or hydronephrosis. Her abdominal exam is within normal limits and have low suspicion for acute intra-abdominal pathology. Denies any vaginal discharge or bleeding and low suspicion for pulmonary pathology. Discussed that symptoms are unlikely related to this nonobstructing kidney stone, and may be musculoskeletal in nature. Patient does not have any red flags of back pain. She is discharged with a prescription for Zofran and ibuprofen. Patient discharged in stable condition with understanding of reasons to return.    Alvira MondayErin Tahjae Clausing, MD 01/13/15 1815

## 2015-01-13 NOTE — ED Notes (Signed)
Pain left flank  Onset early am  Hx kidney stones

## 2015-01-13 NOTE — Discharge Instructions (Signed)
Flank Pain °Flank pain refers to pain that is located on the side of the body between the upper abdomen and the back. The pain may occur over a short period of time (acute) or may be long-term or reoccurring (chronic). It may be mild or severe. Flank pain can be caused by many things. °CAUSES  °Some of the more common causes of flank pain include: °· Muscle strains.   °· Muscle spasms.   °· A disease of your spine (vertebral disk disease).   °· A lung infection (pneumonia).   °· Fluid around your lungs (pulmonary edema).   °· A kidney infection.   °· Kidney stones.   °· A very painful skin rash caused by the chickenpox virus (shingles).   °· Gallbladder disease.   °HOME CARE INSTRUCTIONS  °Home care will depend on the cause of your pain. In general, °· Rest as directed by your caregiver. °· Drink enough fluids to keep your urine clear or pale yellow. °· Only take over-the-counter or prescription medicines as directed by your caregiver. Some medicines may help relieve the pain. °· Tell your caregiver about any changes in your pain. °· Follow up with your caregiver as directed. °SEEK IMMEDIATE MEDICAL CARE IF:  °· Your pain is not controlled with medicine.   °· You have new or worsening symptoms. °· Your pain increases.   °· You have abdominal pain.   °· You have shortness of breath.   °· You have persistent nausea or vomiting.   °· You have swelling in your abdomen.   °· You feel faint or pass out.   °· You have blood in your urine. °· You have a fever or persistent symptoms for more than 2-3 days. °· You have a fever and your symptoms suddenly get worse. °MAKE SURE YOU:  °· Understand these instructions. °· Will watch your condition. °· Will get help right away if you are not doing well or get worse. °  °This information is not intended to replace advice given to you by your health care provider. Make sure you discuss any questions you have with your health care provider. °  °Document Released: 02/17/2005 Document  Revised: 09/21/2011 Document Reviewed: 08/11/2011 °Elsevier Interactive Patient Education ©2016 [employer] ° °

## 2015-01-13 NOTE — ED Notes (Signed)
Patient preparing for discharge. 

## 2017-03-27 IMAGING — CT CT RENAL STONE PROTOCOL
2 of 4 series · 17 of 46 positions shown, 19 images · non-contrast
Comparison: 11/11/2014

CLINICAL DATA: Left flank pain since early this morning. History of
stones.

EXAM:
CT ABDOMEN AND PELVIS WITHOUT CONTRAST
TECHNIQUE: Multidetector CT imaging of the abdomen and pelvis was performed
following the standard protocol without IV contrast.

[Series 2: axial st · axial · 0.72mm/px · z∈[-533,-128]mm · 14 of 89 slices shown, 16 images]
[im 4/89  soft-tissue]
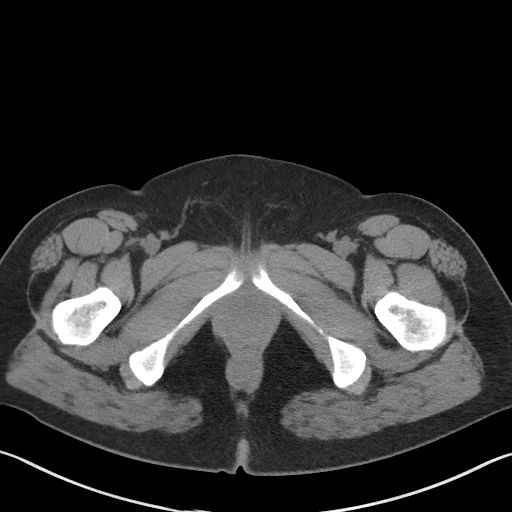
[im 4/89  bone]
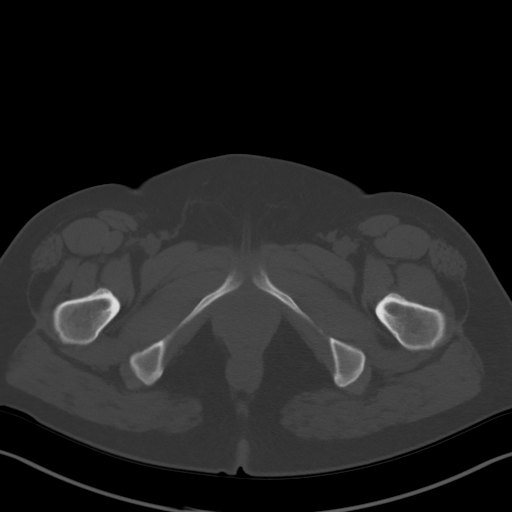
[im 12/89  soft-tissue]
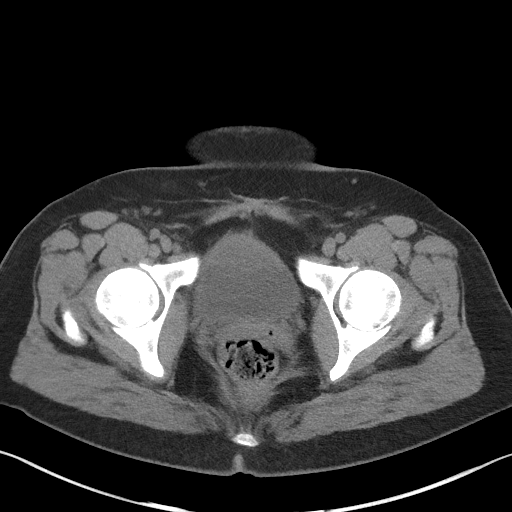
[im 19/89  soft-tissue]
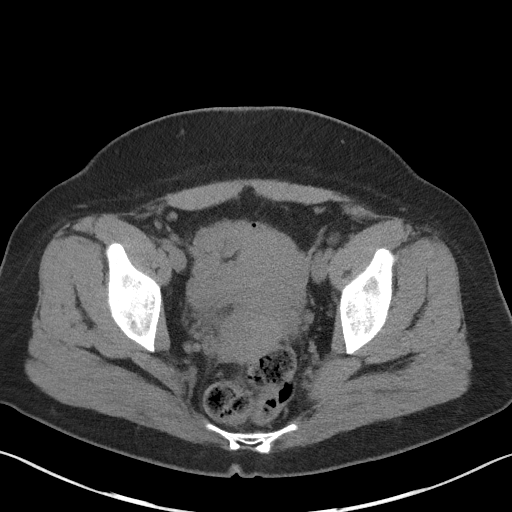
[im 23/89  soft-tissue]
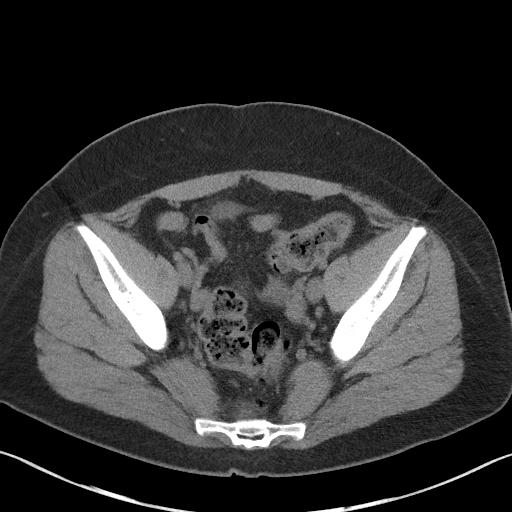
[im 30/89  soft-tissue]
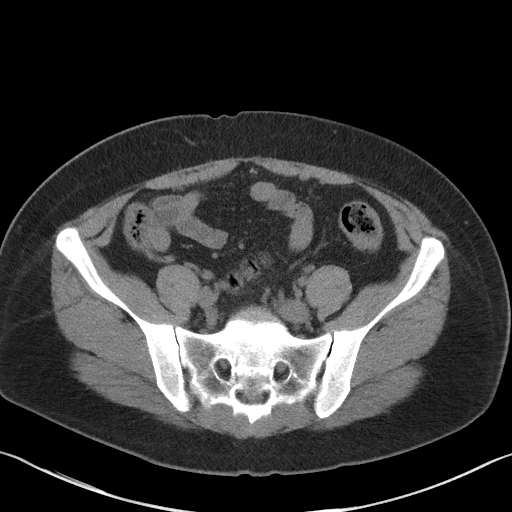
[im 37/89  soft-tissue]
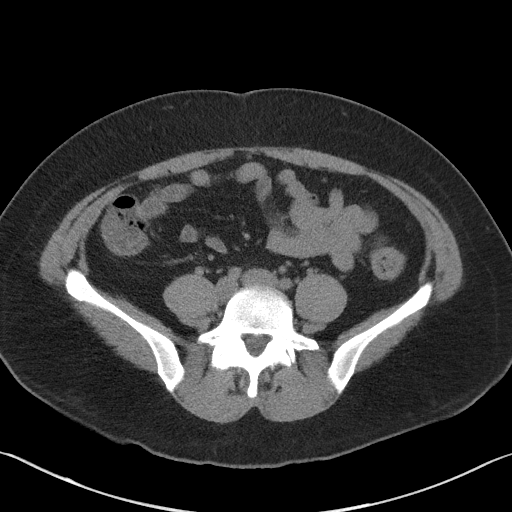
[im 41/89  soft-tissue]
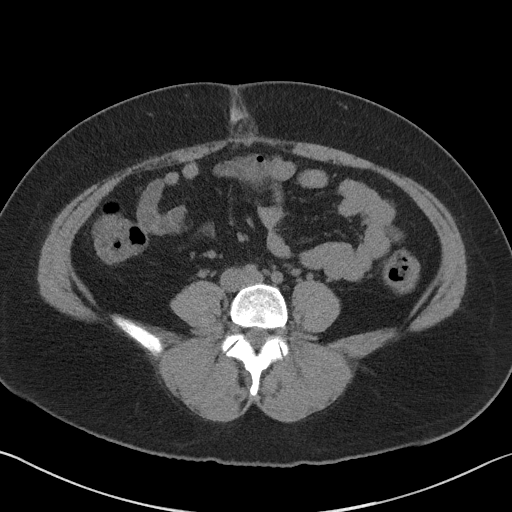
[im 48/89  soft-tissue]
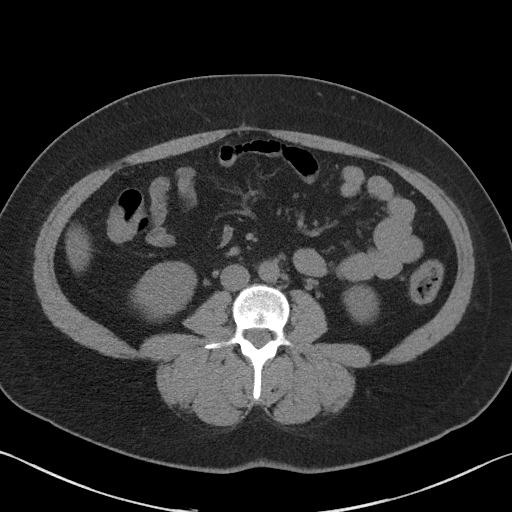
[im 52/89  soft-tissue]
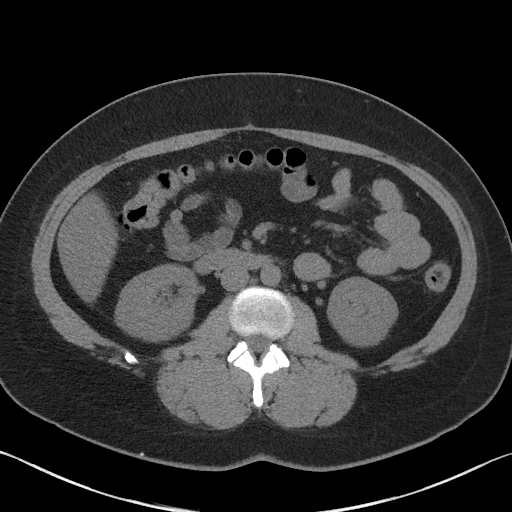
[im 52/89  bone]
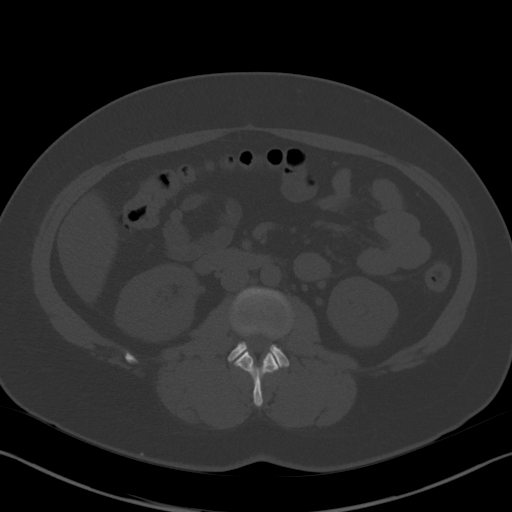
[im 59/89  soft-tissue]
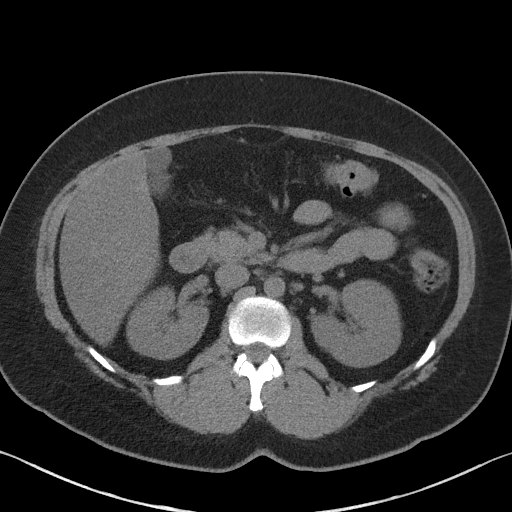
[im 67/89  soft-tissue]
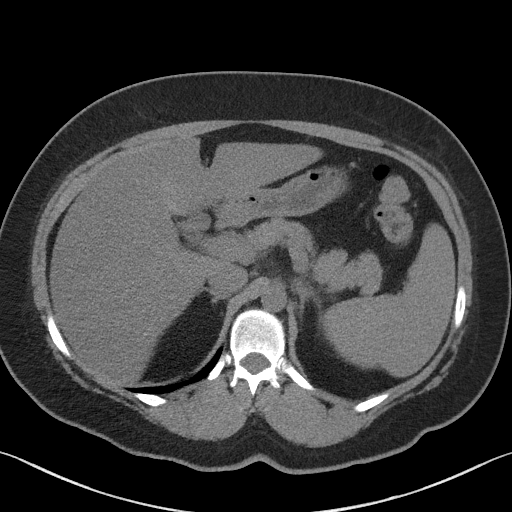
[im 70/89  soft-tissue]
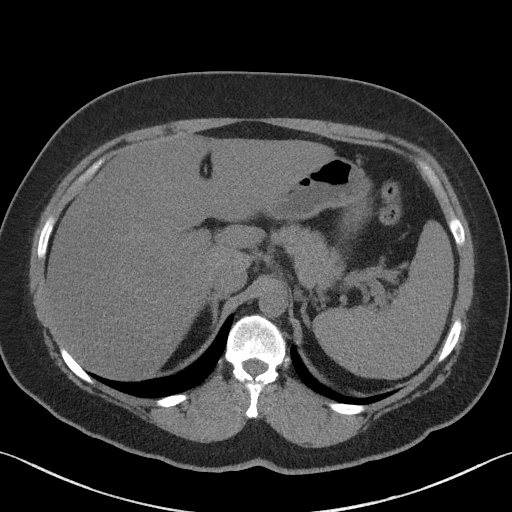
[im 78/89  soft-tissue]
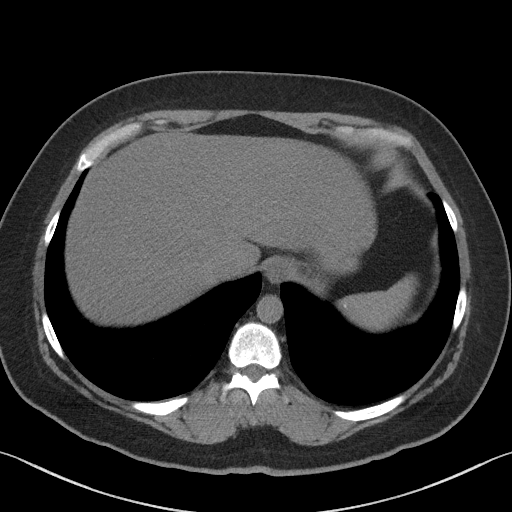
[im 85/89  soft-tissue]
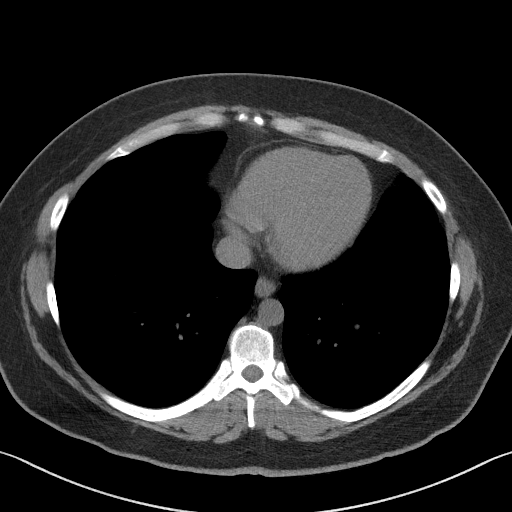

[Series 5: coronal st · coronal · 0.78mm/px · 3 of 86 slices shown]
[im 29/86  soft-tissue]
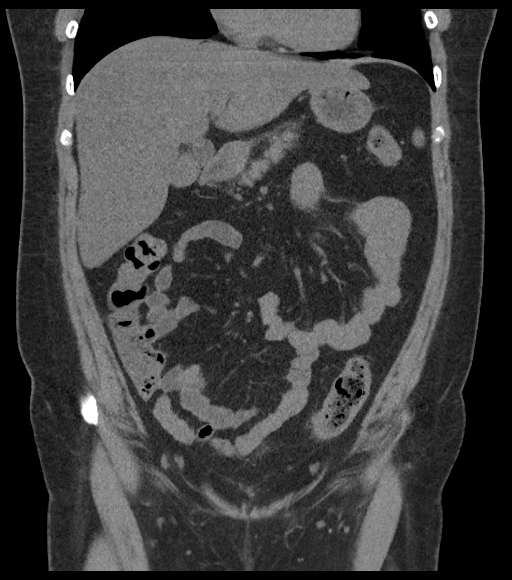
[im 38/86  soft-tissue]
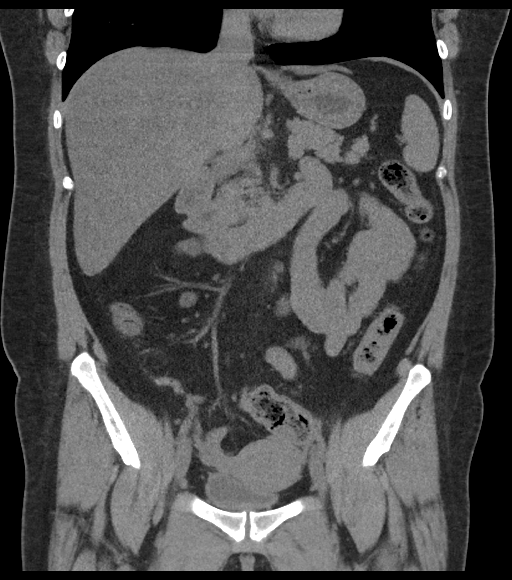
[im 48/86  soft-tissue]
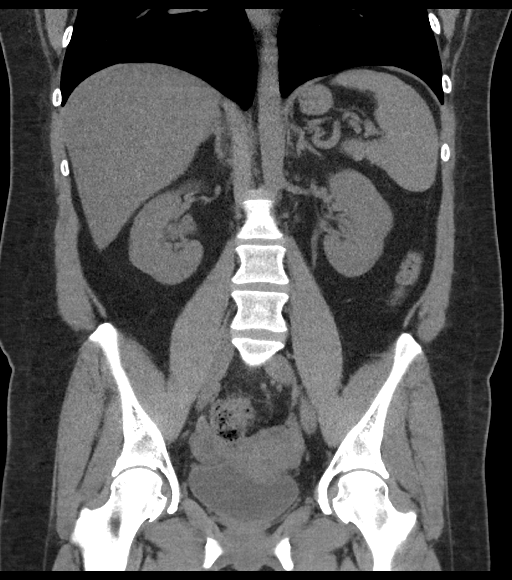

[17 of 46 positions shown; findings below may reference images not displayed]

FINDINGS: Lung bases are clear.  No effusions.  Heart is normal size.

Mild diffuse fatty infiltration of the liver. No visible focal
abnormality. Gallbladder, pancreas, adrenals have an unremarkable
unenhanced appearance. Small calcification in the superior spleen,
likely old granuloma.

Punctate nonobstructing stone in the upper pole of the left kidney.
No hydronephrosis or ureteral stones. Urinary bladder, uterus,
adnexae a have an unremarkable unenhanced appearance. No stones on
the right.

Bowel grossly unremarkable.  No free fluid, free air, or adenopathy.

No acute bony abnormality or focal bone lesion.
IMPRESSION: Punctate left upper pole nephrolithiasis, stable. No ureteral stones
or hydronephrosis.

Fatty liver.
